# Patient Record
Sex: Female | Born: 1965 | Hispanic: Yes | Marital: Married | Smoking: Never smoker
Health system: Southern US, Community
[De-identification: ages and names within clinical notes are randomized; demographics above are authoritative.]

## PROBLEM LIST (undated history)

## (undated) DIAGNOSIS — R945 Abnormal results of liver function studies: Secondary | ICD-10-CM

## (undated) DIAGNOSIS — R7989 Other specified abnormal findings of blood chemistry: Secondary | ICD-10-CM

## (undated) HISTORY — DX: Other specified abnormal findings of blood chemistry: R79.89

## (undated) HISTORY — DX: Abnormal results of liver function studies: R94.5

---

## 2006-09-20 ENCOUNTER — Other Ambulatory Visit: Payer: Self-pay

## 2006-09-20 ENCOUNTER — Emergency Department: Payer: Self-pay | Admitting: Emergency Medicine

## 2006-12-14 ENCOUNTER — Ambulatory Visit: Payer: Self-pay | Admitting: Unknown Physician Specialty

## 2015-01-26 ENCOUNTER — Emergency Department: Payer: BLUE CROSS/BLUE SHIELD

## 2015-01-26 ENCOUNTER — Observation Stay
Admission: EM | Admit: 2015-01-26 | Discharge: 2015-01-29 | Disposition: A | Discharge status: Home / Self Care | Payer: BLUE CROSS/BLUE SHIELD | Attending: Surgery | Admitting: Surgery

## 2015-01-26 ENCOUNTER — Encounter: Payer: Self-pay | Admitting: Emergency Medicine

## 2015-01-26 DIAGNOSIS — K8070 Calculus of gallbladder and bile duct without cholecystitis without obstruction: Secondary | ICD-10-CM | POA: Diagnosis not present

## 2015-01-26 DIAGNOSIS — R1013 Epigastric pain: Secondary | ICD-10-CM | POA: Diagnosis present

## 2015-01-26 DIAGNOSIS — K8071 Calculus of gallbladder and bile duct without cholecystitis with obstruction: Secondary | ICD-10-CM | POA: Diagnosis not present

## 2015-01-26 DIAGNOSIS — K8020 Calculus of gallbladder without cholecystitis without obstruction: Secondary | ICD-10-CM | POA: Diagnosis present

## 2015-01-26 DIAGNOSIS — K8066 Calculus of gallbladder and bile duct with acute and chronic cholecystitis without obstruction: Principal | ICD-10-CM | POA: Insufficient documentation

## 2015-01-26 DIAGNOSIS — Z79899 Other long term (current) drug therapy: Secondary | ICD-10-CM | POA: Diagnosis not present

## 2015-01-26 DIAGNOSIS — R109 Unspecified abdominal pain: Secondary | ICD-10-CM | POA: Insufficient documentation

## 2015-01-26 LAB — COMPREHENSIVE METABOLIC PANEL
ALT: 604 U/L — ABNORMAL HIGH (ref 14–54)
AST: 851 U/L — AB (ref 15–41)
Albumin: 4.5 g/dL (ref 3.5–5.0)
Alkaline Phosphatase: 120 U/L (ref 38–126)
Anion gap: 10 (ref 5–15)
BUN: 13 mg/dL (ref 6–20)
CALCIUM: 9.6 mg/dL (ref 8.9–10.3)
CHLORIDE: 105 mmol/L (ref 101–111)
CO2: 25 mmol/L (ref 22–32)
CREATININE: 0.58 mg/dL (ref 0.44–1.00)
Glucose, Bld: 121 mg/dL — ABNORMAL HIGH (ref 65–99)
Potassium: 3.6 mmol/L (ref 3.5–5.1)
Sodium: 140 mmol/L (ref 135–145)
Total Bilirubin: 3.6 mg/dL — ABNORMAL HIGH (ref 0.3–1.2)
Total Protein: 7.7 g/dL (ref 6.5–8.1)

## 2015-01-26 LAB — CBC
HCT: 43.5 % (ref 35.0–47.0)
Hemoglobin: 14.4 g/dL (ref 12.0–16.0)
MCH: 29 pg (ref 26.0–34.0)
MCHC: 33 g/dL (ref 32.0–36.0)
MCV: 87.8 fL (ref 80.0–100.0)
Platelets: 273 10*3/uL (ref 150–440)
RBC: 4.95 MIL/uL (ref 3.80–5.20)
RDW: 12.9 % (ref 11.5–14.5)
WBC: 5.9 10*3/uL (ref 3.6–11.0)

## 2015-01-26 LAB — TROPONIN I

## 2015-01-26 LAB — LIPASE, BLOOD: Lipase: 57 U/L — ABNORMAL HIGH (ref 22–51)

## 2015-01-26 MED ORDER — ACETAMINOPHEN 650 MG RE SUPP: 650.0000 mg | Freq: Four times a day (QID) | RECTAL | Status: DC | PRN

## 2015-01-26 MED ORDER — HYDROMORPHONE HCL 1 MG/ML IJ SOLN: 1.0000 mg | INTRAMUSCULAR | Status: DC | PRN

## 2015-01-26 MED ORDER — KCL IN DEXTROSE-NACL 20-5-0.45 MEQ/L-%-% IV SOLN
INTRAVENOUS | Status: DC
  Administered 2015-01-26: 1000 mL via INTRAVENOUS
  Administered 2015-01-27 – 2015-01-28 (×4): via INTRAVENOUS
  Filled 2015-01-26 (×10): qty 1000

## 2015-01-26 MED ORDER — ACETAMINOPHEN 325 MG PO TABS: 650.0000 mg | ORAL_TABLET | Freq: Four times a day (QID) | ORAL | Status: DC | PRN

## 2015-01-26 MED ORDER — HYDROMORPHONE HCL 1 MG/ML IJ SOLN
INTRAMUSCULAR | Status: AC
  Administered 2015-01-26: 1 mg via INTRAVENOUS
  Filled 2015-01-26: qty 1

## 2015-01-26 MED ORDER — HYDROMORPHONE HCL 1 MG/ML IJ SOLN
1.0000 mg | Freq: Once | INTRAMUSCULAR | Status: AC
  Administered 2015-01-26: 1 mg via INTRAVENOUS

## 2015-01-26 MED ORDER — ONDANSETRON HCL 4 MG/2ML IJ SOLN
4.0000 mg | Freq: Once | INTRAMUSCULAR | Status: AC
  Administered 2015-01-26: 4 mg via INTRAVENOUS

## 2015-01-26 MED ORDER — HEPARIN SODIUM (PORCINE) 5000 UNIT/ML IJ SOLN
5000.0000 [IU] | Freq: Three times a day (TID) | INTRAMUSCULAR | Status: DC
  Administered 2015-01-26 – 2015-01-29 (×7): 5000 [IU] via SUBCUTANEOUS
  Filled 2015-01-26 (×8): qty 1

## 2015-01-26 MED ORDER — ONDANSETRON HCL 4 MG/2ML IJ SOLN
4.0000 mg | Freq: Four times a day (QID) | INTRAMUSCULAR | Status: DC | PRN
  Administered 2015-01-28: 4 mg via INTRAVENOUS

## 2015-01-26 MED ORDER — GADOBENATE DIMEGLUMINE 529 MG/ML IV SOLN
15.0000 mL | Freq: Once | INTRAVENOUS | Status: AC | PRN
  Administered 2015-01-26: 15 mL via INTRAVENOUS

## 2015-01-26 MED ORDER — ONDANSETRON HCL 4 MG PO TABS: 4.0000 mg | ORAL_TABLET | Freq: Four times a day (QID) | ORAL | Status: DC | PRN

## 2015-01-26 MED ORDER — ONDANSETRON HCL 4 MG/2ML IJ SOLN
INTRAMUSCULAR | Status: AC
  Administered 2015-01-26: 4 mg via INTRAVENOUS
  Filled 2015-01-26: qty 2

## 2015-01-26 MED ORDER — HYDROCODONE-ACETAMINOPHEN 5-325 MG PO TABS
1.0000 | ORAL_TABLET | ORAL | Status: DC | PRN
  Filled 2015-01-26: qty 1

## 2015-01-26 MED ORDER — SODIUM CHLORIDE 0.9 % IV SOLN
3.0000 g | Freq: Four times a day (QID) | INTRAVENOUS | Status: DC
  Administered 2015-01-26 – 2015-01-28 (×8): 3 g via INTRAVENOUS
  Filled 2015-01-26 (×12): qty 3

## 2015-01-26 NOTE — H&P (Signed)
Hailey Thomas is an 49 y.o. female.     Chief Complaint: Sudden onset of epigastric abdominal pain at 5:00 yesterday afternoon.  HPI:   This is a 49 year old Hispanic female without past medical or surgical history who presents to the emergency room this afternoon following the sudden onset of epigastric abdominal pain yesterday afternoon at around 5 pm. The patient had no nausea and no vomiting. She states that the pain was intense for an hour or two following initial onset. Following this the patient had some relief of her abdominal pain symptoms. The patient's had no jaundice, no fever and no prior episodes of such. There is no family history of cholelithiasis noted. She's had no history of diverticulitis or peptic ulcer disease. There is been no history of blood transfusion or intravenous drug use.   She worked her normal third shift then came home and had some coffee and then had worsening of the same type of pain.  She denies any nausea.  She came to the emergency room and was found to have gallstones on ultrasound as well as markedly elevated liver function tests. Surgical services were asked to evaluate.  History reviewed. No pertinent past medical history.  History reviewed. No pertinent past surgical history.  Family history is negative for cholelithiasis.  Social History:  reports that she has never smoked. She does not have any smokeless tobacco history on file. She reports that she does not drink alcohol. Her drug history is not on file.  Allergies: No Known Allergies  Medications Prior to Admission  Medication Sig Dispense Refill  . Cholecalciferol (VITAMIN D3) 5000 UNITS TABS Take 1 tablet by mouth daily.       Review of Systems:   Review of Systems  Constitutional: Negative for fever, chills, weight loss, malaise/fatigue and diaphoresis.  HENT: Negative.   Eyes: Negative.   Respiratory: Negative for cough, hemoptysis and shortness of breath.   Cardiovascular:  Negative for chest pain and palpitations.  Gastrointestinal: Positive for abdominal pain. Negative for heartburn, nausea, vomiting, diarrhea, constipation, blood in stool and melena.       No jaundice.  Genitourinary: Negative.   Musculoskeletal: Negative.   Skin: Negative.   Neurological: Negative.  Negative for weakness.  Psychiatric/Behavioral: Negative for depression and suicidal ideas.  All other systems reviewed and are negative.   Physical Exam:  Physical Exam  Constitutional: She is oriented to person, place, and time and well-developed, well-nourished, and in no distress. No distress.  HENT:  Head: Normocephalic and atraumatic.  Eyes: Pupils are equal, round, and reactive to light. No scleral icterus.  Neck: Normal range of motion. Neck supple. No thyromegaly present.  Cardiovascular: Normal rate and regular rhythm.   No murmur heard. Pulmonary/Chest: Effort normal and breath sounds normal.  Abdominal: Soft. She exhibits no distension, no ascites and no mass. There is no hepatosplenomegaly. There is tenderness in the right upper quadrant and epigastric area. There is positive Murphy's sign. There is no rigidity, no rebound, no guarding and no tenderness at McBurney's point. No hernia.    Musculoskeletal: Normal range of motion. She exhibits no edema.  Neurological: She is alert and oriented to person, place, and time.  Skin: Skin is warm and dry. She is not diaphoretic.  Psychiatric: Memory, affect and judgment normal.    Blood pressure 130/80, pulse 58, temperature 98.2 F (36.8 C), temperature source Oral, resp. rate 18, height $RemoveBe'4\' 7"'lxalBIWws$  (1.397 m), weight 70.58 kg (155 lb 9.6 oz), last menstrual period  01/02/2015, SpO2 92 %.    Results for orders placed or performed during the hospital encounter of 01/26/15 (from the past 48 hour(s))  CBC     Status: None   Collection Time: 01/26/15  9:32 AM  Result Value Ref Range   WBC 5.9 3.6 - 11.0 K/uL   RBC 4.95 3.80 - 5.20  MIL/uL   Hemoglobin 14.4 12.0 - 16.0 g/dL   HCT 43.5 35.0 - 47.0 %   MCV 87.8 80.0 - 100.0 fL   MCH 29.0 26.0 - 34.0 pg   MCHC 33.0 32.0 - 36.0 g/dL   RDW 12.9 11.5 - 14.5 %   Platelets 273 150 - 440 K/uL  Troponin I     Status: None   Collection Time: 01/26/15  9:32 AM  Result Value Ref Range   Troponin I <0.03 <0.031 ng/mL    Comment:        NO INDICATION OF MYOCARDIAL INJURY.   Comprehensive metabolic panel     Status: Abnormal   Collection Time: 01/26/15  9:32 AM  Result Value Ref Range   Sodium 140 135 - 145 mmol/L   Potassium 3.6 3.5 - 5.1 mmol/L   Chloride 105 101 - 111 mmol/L   CO2 25 22 - 32 mmol/L   Glucose, Bld 121 (H) 65 - 99 mg/dL   BUN 13 6 - 20 mg/dL   Creatinine, Ser 0.58 0.44 - 1.00 mg/dL   Calcium 9.6 8.9 - 10.3 mg/dL   Total Protein 7.7 6.5 - 8.1 g/dL   Albumin 4.5 3.5 - 5.0 g/dL   AST 851 (H) 15 - 41 U/L   ALT 604 (H) 14 - 54 U/L   Alkaline Phosphatase 120 38 - 126 U/L   Total Bilirubin 3.6 (H) 0.3 - 1.2 mg/dL   GFR calc non Af Amer >60 >60 mL/min   GFR calc Af Amer >60 >60 mL/min    Comment: (NOTE) The eGFR has been calculated using the CKD EPI equation. This calculation has not been validated in all clinical situations. eGFR's persistently <60 mL/min signify possible Chronic Kidney Disease.    Anion gap 10 5 - 15  Lipase, blood     Status: Abnormal   Collection Time: 01/26/15  9:32 AM  Result Value Ref Range   Lipase 57 (H) 22 - 51 U/L   Mr Lambert Mody Cm/mrcp  01/26/2015   CLINICAL DATA:  Subsequent encounter for severe upper epigastric pain since yesterday nausea and vomiting with multiple gallstones seen on recent ultrasound exam.  EXAM: MRI ABDOMEN WITHOUT CONTRAST  (INCLUDING MRCP)  TECHNIQUE: Multiplanar multisequence MR imaging of the abdomen was performed. Heavily T2-weighted images of the biliary and pancreatic ducts were obtained, and three-dimensional MRCP images were rendered by post processing.  COMPARISON:  Ultrasound exam from earlier  today.  FINDINGS: Lower chest:  Unremarkable.  Hepatobiliary: No focal abnormality within the liver parenchyma. All multiple gallstones are evident measuring up to 10 mm in diameter. No evidence for gallbladder wall thickening or pericholecystic fluid. There is no intra or extrahepatic biliary duct dilatation. No evidence for choledocholithiasis.  Pancreas: No focal mass lesion. No dilatation of the main duct. No intraparenchymal cyst. No peripancreatic edema.  Spleen: No splenomegaly. No focal mass lesion.  Adrenals/Urinary Tract: No adrenal nodule or mass. No focal abnormality or hydronephrosis is seen in either kidney.  Stomach/Bowel: Stomach is distended with food and fluid. Duodenum is normally positioned as is the ligament of Treitz.  Vascular/Lymphatic: No abdominal aortic  aneurysm. No evidence for lymphadenopathy in the abdomen  Other: No intraperitoneal free fluid.  Musculoskeletal: No abnormal marrow signal within the visualized bony anatomy.  IMPRESSION: Cholelithiasis without gallbladder wall thickening or pericholecystic fluid.  No evidence for biliary duct dilatation.  No choledocholithiasis.   Electronically Signed   By: Misty Stanley M.D.   On: 01/26/2015 12:46   US Abdomen Limited Ruq  01/26/2015   CLINICAL DATA:  Epigastric pain since early this morning.  EXAM: US ABDOMEN LIMITED - RIGHT UPPER QUADRANT  COMPARISON:  None.  FINDINGS: Gallbladder:  Gallbladder is moderately distended containing numerous shadowing stones and sludge. No wall thickening. No pericholecystic fluid.  Common bile duct:  Diameter: 2.7 mm.  No sonographic evidence of a duct stone.  Liver:  No focal lesion identified. Within normal limits in parenchymal echogenicity.  IMPRESSION: 1. Cholelithiasis.  No sonographic evidence of acute cholecystitis. 2. No bile duct dilation.  Normal liver.   Electronically Signed   By: Lajean Manes M.D.   On: 01/26/2015 10:37   Images were personally reviewed reviewed on the PACS  monitor.  Assessment/Plan  This is an otherwise healthy 49 year old Hispanic female with cholelithiasis and abdominal pain along with hyperbilirubinemia and transaminitis with what looks like passed choledocholithiasis. I do not see any obvious findings of choledocholithiasis on her MRI/MRCP scan. Her history and laboratory biochemical data along with her exam are suggestive of a passed stone in the bile duct.    I discussed with her and her husband at length that she would likely have symptomatic improvement by tomorrow with hydration and IV antibiotics.  I will repeat her liver function tests in the morning and suspect that they will improve. Following this the patient would and if it from elective cholecystectomy with intraoperative cholangiography. I will reassess her in the morning. All of her questions were answered.  Plan and diagnosis discussed also with her RN.  Hortencia Conradi, MD, FACS

## 2015-01-26 NOTE — ED Notes (Signed)
Epigastric pain since last pm.

## 2015-01-26 NOTE — ED Notes (Signed)
Patient transported to MRI at this time. 

## 2015-01-26 NOTE — ED Provider Notes (Signed)
[facility] Emergency Department Provider Note  ____________________________________________  Time seen:  9:30 AM  I have reviewed the triage vital signs and the nursing notes.   HISTORY  Chief Complaint Abdominal Pain     HPI Hailey Thomas is a 49 y.o. female who reports she began having epigastric pain and pressure last night. This continues to be uncomfortable for her. It is moderate. She feels like she needs to burp. She points to a rather focal area at the bottom of her sternum and epigastric area. She denies anynausea or shortness of breath. She reports she had similar symptoms 6-7 years ago and was diagnosed with reflux then. She does not have any history of hypertension, diabetes, or heart disease. She has no past surgical history.    History reviewed. No pertinent past medical history.  There are no active problems to display for this patient.   History reviewed. No pertinent past surgical history.  Current Outpatient Rx  Name  Route  Sig  Dispense  Refill  . Cholecalciferol (VITAMIN D3) 5000 UNITS TABS   Oral   Take 1 tablet by mouth daily.           Allergies Review of patient's allergies indicates no known allergies.  No family history on file.  Social History History  Substance Use Topics  . Smoking status: Never Smoker   . Smokeless tobacco: Not on file  . Alcohol Use: No    Review of Systems  Constitutional: Negative for fever. ENT: Negative for sore throat. Cardiovascular: Epigastric discomfort. See history of present illness Respiratory: Negative for shortness of breath. Gastrointestinal: Epigastric discomfort. See history of present illness Genitourinary: Negative for dysuria. Musculoskeletal: No myalgias or injuries. Skin: Negative for rash. Neurological: Negative for headaches   10-point ROS otherwise negative.  ____________________________________________   PHYSICAL EXAM:  VITAL SIGNS: ED Triage  Vitals  Enc Vitals Group     BP 01/26/15 0910 148/92 mmHg     Pulse Rate 01/26/15 0910 60     Resp 01/26/15 0910 18     Temp 01/26/15 0910 98.3 F (36.8 C)     Temp Source 01/26/15 0910 Oral     SpO2 01/26/15 0910 100 %     Weight 01/26/15 0910 162 lb (73.483 kg)     Height 01/26/15 0910 4\' 7"  (1.397 m)     Head Cir --      Peak Flow --      Pain Score 01/26/15 0911 9     Pain Loc --      Pain Edu? --      Excl. in GC? --     Constitutional:  Alert and oriented. When initially seen, the patient is supine on the bed and appears uncomfortable.  ENT   Head: Normocephalic and atraumatic.   Nose: No congestion/rhinnorhea. Cardiovascular: Normal rate, regular rhythm, no murmur noted.  No tenderness to the chest/sternum. Respiratory:  Normal respiratory effort, no tachypnea.    Breath sounds are clear and equal bilaterally.  Gastrointestinal: Soft with mild tenderness to the epigastric area. No tenderness in the right upper quadrant or elsewhere.. No distention.  Back: No muscle spasm, no tenderness, no CVA tenderness. Musculoskeletal: No deformity noted. Nontender with normal range of motion in all extremities.  No noted edema. Neurologic:  Normal speech and language. No gross focal neurologic deficits are appreciated.  Skin:  Skin is warm, dry. No rash noted. Psychiatric: Patient is alert and communicative with an intact thought process.  She overall appears appropriate. She does appear uncomfortable and has a slightly odd affect.  ____________________________________________    LABS (pertinent positives/negatives)  CBC: Within normal limits Troponin: Negative Metabolic panel: Normal electrolytes and renal function. Glucose of 121. LFTs are elevated with AST of 851 and ALT 604, bilirubin 3.6. Lipase 57  ____________________________________________   EKG  ED ECG REPORT I, Klare Criss W, the attending physician, personally viewed and interpreted this ECG.   Date:  01/26/2015  EKG Time: 9:13 AM  Rate: 56  Rhythm: Sinus bradycardia  Axis: Normal  Intervals: Normal  ST&T Change: None noted   ____________________________________________    RADIOLOGY  Ultrasound of the right of a quadrant shows a gallbladder that is somewhat distended with stones and sludge. No wall thickening or color 6 fluid. The common bile duct appears normal.  ____________________________________________  ____________________________________________   INITIAL IMPRESSION / ASSESSMENT AND PLAN / ED COURSE  Pertinent labs & imaging results that were available during my care of the patient were reviewed by me and considered in my medical decision making (see chart for details).  Patient with upper abdominal pain worrisome for biliary colic area and we'll obtain an ultrasound.  ----------------------------------------- 11:00 AM on 01/26/2015 -----------------------------------------  Patient's LFTs are elevated along with bilirubin. Her ultrasound shows gallstones but no biliary duct dilatation. We have called and spoken with Dr. Natale Lay, general surgery. He is requesting an MRCP and will see the patient in the emergency department.  We will treat the patient's pain with IV Dilaudid at this time.  ____________________________________________   FINAL CLINICAL IMPRESSION(S) / ED DIAGNOSES  Final diagnoses:  Abdominal pain  Gallstones  Hyperbilirubinemia      Darien Ramus, MD 01/26/15 [phone]

## 2015-01-27 DIAGNOSIS — R109 Unspecified abdominal pain: Secondary | ICD-10-CM | POA: Insufficient documentation

## 2015-01-27 LAB — COMPREHENSIVE METABOLIC PANEL
ALT: 635 U/L — ABNORMAL HIGH (ref 14–54)
ANION GAP: 5 (ref 5–15)
AST: 530 U/L — ABNORMAL HIGH (ref 15–41)
Albumin: 3.6 g/dL (ref 3.5–5.0)
Alkaline Phosphatase: 118 U/L (ref 38–126)
BUN: 7 mg/dL (ref 6–20)
CO2: 28 mmol/L (ref 22–32)
CREATININE: 0.52 mg/dL (ref 0.44–1.00)
Calcium: 8.6 mg/dL — ABNORMAL LOW (ref 8.9–10.3)
Chloride: 107 mmol/L (ref 101–111)
GFR calc Af Amer: >60 mL/min (ref >60)
GFR calc non Af Amer: >60 mL/min (ref >60)
Glucose, Bld: 110 mg/dL — ABNORMAL HIGH (ref 65–99)
Potassium: 3.6 mmol/L (ref 3.5–5.1)
Sodium: 140 mmol/L (ref 135–145)
TOTAL PROTEIN: 6.3 g/dL — AB (ref 6.5–8.1)
Total Bilirubin: 4.8 mg/dL — ABNORMAL HIGH (ref 0.3–1.2)

## 2015-01-27 LAB — SURGICAL PCR SCREEN
MRSA, PCR: NEGATIVE
Staphylococcus aureus: NEGATIVE

## 2015-01-27 NOTE — Progress Notes (Signed)
[facility]   PATIENT NAME: Hailey Thomas    MR#:  [id number]  DATE OF BIRTH:  02/27/1966  SUBJECTIVE:   Feels somewhat better but continued epigastric pain. No nausea no vomiting.    Interested in timing of surgery planned.  REVIEW OF SYSTEMS:   Review of Systems  Constitutional: Negative for fever and chills.  Gastrointestinal: Positive for abdominal pain. Negative for nausea and vomiting.  Neurological: Negative for headaches.  Psychiatric/Behavioral: Negative.   All other systems reviewed and are negative.   DRUG ALLERGIES:  No Known Allergies  VITALS:  Blood pressure 113/72, pulse 48, temperature 98.4 F (36.9 C), temperature source Oral, resp. rate 16, height 4\' 7"  (1.397 m), weight 68.72 kg (151 lb 8 oz), last menstrual period 01/02/2015, SpO2 98 %.  Filed Vitals:   01/26/15 1350 01/26/15 2316 01/27/15 0512 01/27/15 0805  BP:  130/72  113/72  Pulse:  51  48  Temp:  98.2 F (36.8 C)  98.4 F (36.9 C)  TempSrc:  Oral  Oral  Resp:  18  16  Height:      Weight: 70.58 kg (155 lb 9.6 oz)  68.72 kg (151 lb 8 oz)   SpO2:  97%  98%   PHYSICAL EXAMINATION:  GENERAL:  [age] y.o.-year-old patient lying in the bed with no acute distress.  EYES: Pupils equal, round, reactive to light and accommodation. Mild scleral icterus. Extraocular muscles intact.  HEENT: Head atraumatic, normocephalic. Oropharynx and nasopharynx clear.  NECK:  Supple, no jugular venous distention. No thyroid enlargement, no tenderness.  LUNGS: Normal breath sounds bilaterally, no wheezing, rales,rhonchi or crepitation. No use of accessory muscles of respiration.  CARDIOVASCULAR: S1, S2 normal. No murmurs, rubs, or gallops.  ABDOMEN: Soft, tender in epigastric area, nondistended. Bowel sounds present. No organomegaly or mass.  EXTREMITIES: No pedal edema, cyanosis, or clubbing.  NEUROLOGIC: Cranial nerves II through XII are intact. Muscle strength 5/5 in all extremities. Sensation intact.  Gait not checked.  PSYCHIATRIC: The patient is alert and oriented x 3.  SKIN: No obvious rash, lesion, or ulcer.    CMP Latest Ref Rng 01/27/2015 01/26/2015  Glucose 65 - 99 mg/dL 914(N110(H) 829(F121(H)  BUN 6 - 20 mg/dL 7 13  Creatinine 6.210.44 - 1.00 mg/dL 3.080.52 6.570.58  Sodium 846135 - 145 mmol/L 140 140  Potassium 3.5 - 5.1 mmol/L 3.6 3.6  Chloride 101 - 111 mmol/L 107 105  CO2 22 - 32 mmol/L 28 25  Calcium 8.9 - 10.3 mg/dL 9.6(E8.6(L) 9.6  Total Protein 6.5 - 8.1 g/dL 6.3(L) 7.7  Total Bilirubin 0.3 - 1.2 mg/dL 4.8(H) 3.6(H)  Alkaline Phos 38 - 126 U/L 118 120  AST 15 - 41 U/L 530(H) 851(H)  ALT 14 - 54 U/L 635(H) 604(H)    ASSESSMENT AND PLAN:   Rising bilirubin but decreasing transaminases. The patient either has a retained common bile duct stone which is not visible an MRCP or she has periampullary edema secondary to a passed stone. I will get gastroenterology to see her in anticipation of a possible ERCP based on liver function testing tomorrow morning. At present time until the duct is cleared I am not planning cholecystectomy. I discussed this with her and she is in agreement.  MRCP scan was personally reviewed yesterday.

## 2015-01-27 NOTE — Consult Note (Signed)
GI Inpatient Consult Note Christena Deem MD  Reason for Consult: Abnormal liver associated enzymes possible need for ERCP.   Attending Requesting Consult: Dr. Loura Back  Outpatient Primary Physician: Unknown  History of Present Illness: Hailey Thomas is a 49 y.o. female who presented to the emergency room at [facility] last night with a complaint of right upper quadrant pain. He states that this began yesterday early in the morning ever over the course of the day became worse. She did take some Alka-Seltzer which seem to be of no benefit. Came to the emergency room early this morning. She has had nausea but no vomiting. Had a similar episode several years ago.  She is currently feeling much better. Prior to this episode beginning she has had no problems with nausea vomiting or abdominal pain. There has been no heartburn or dysphagia. He usually has a bowel movement daily. She has seen no black stools. She states she saw some blood with her stool about a year ago but that has not repeated. She has never had a colonoscopy. There is no history of peptic ulcer disease.  GI family history-negative for gallbladder problems. Her father had peptic ulcer disease. There is no family history of colon cancer or cirrhosis.  Past Medical History:  History reviewed. No pertinent past medical history.  Problem List: Patient Active Problem List   Diagnosis Date Noted  . Cholelithiasis 01/26/2015    Past Surgical History: History reviewed. No pertinent past surgical history.  Allergies: No Known Allergies  Home Medications: Prescriptions prior to admission  Medication Sig Dispense Refill Last Dose  . Cholecalciferol (VITAMIN D3) 5000 UNITS TABS Take 1 tablet by mouth daily.   01/25/2015 at am   Home medication reconciliation was completed with the patient.   Scheduled Inpatient Medications:   . ampicillin-sulbactam (UNASYN) IV  3 g Intravenous Q6H  . heparin  5,000  Units Subcutaneous 3 times per day    Continuous Inpatient Infusions:   . dextrose 5 % and 0.45 % NaCl with KCl 20 mEq/L 125 mL/hr at 01/27/15 1330    PRN Inpatient Medications:  acetaminophen **OR** acetaminophen, HYDROcodone-acetaminophen, HYDROmorphone (DILAUDID) injection, ondansetron **OR** ondansetron (ZOFRAN) IV  Family History: family history is not on file.   GI Family History: Noted above  Social History:   reports that she has never smoked. She does not have any smokeless tobacco history on file. She reports that she does not drink alcohol. The patient denies ETOH, tobacco, or drug use.   ROS  Review of Systems: 10 systems reviewed per admission history and physical, agree with same.  Physical Examination: BP 139/89 mmHg  Pulse 55  Temp(Src) 97.5 F (36.4 C) (Oral)  Resp 16  Ht  (1.397 m)  Wt 68.72 kg (151 lb 8 oz)  BMI 35.21 kg/m2  SpO2 100%  LMP 01/02/2015 Gen: Well-appearing 49 year old female no acute distress HEENT: Normocephalic atraumatic eyes are anicteric Neck: Supple no JVD no lymphadenopathy no thyromegaly Chest: Bilaterally clear to auscultation CV: Regular rate and rhythm without rub murmur or gallop Abd: Nontender nondistended bowel sounds positive and normoactive Ext: No clubbing cyanosis or edema Skin: Warm, dry Other:  Data: Lab Results  Component Value Date   WBC 5.9 01/26/2015   HGB 14.4 01/26/2015   HCT 43.5 01/26/2015   MCV 87.8 01/26/2015   PLT 273 01/26/2015    Recent Labs Lab 01/26/15 0932  HGB 14.4   Lab Results  Component Value Date  NA 140 01/27/2015   K 3.6 01/27/2015   CL 107 01/27/2015   CO2 28 01/27/2015   BUN 7 01/27/2015   CREATININE 0.52 01/27/2015   Lab Results  Component Value Date   ALT 635* 01/27/2015   AST 530* 01/27/2015   ALKPHOS 118 01/27/2015   BILITOT 4.8* 01/27/2015   No results for input(s): APTT, INR, PTT in the last 168 hours. CBC Latest Ref Rng 01/26/2015  WBC 3.6 - 11.0 K/uL  5.9  Hemoglobin 12.0 - 16.0 g/dL 40.914.4  Hematocrit 81.135.0 - 47.0 % 43.5  Platelets 150 - 440 K/uL 273    STUDIES: Mr Jorja Loabd W Cm/mrcp  01/26/2015   CLINICAL DATA:  Subsequent encounter for severe upper epigastric pain since yesterday nausea and vomiting with multiple gallstones seen on recent ultrasound exam.  EXAM: MRI ABDOMEN WITHOUT CONTRAST  (INCLUDING MRCP)  TECHNIQUE: Multiplanar multisequence MR imaging of the abdomen was performed. Heavily T2-weighted images of the biliary and pancreatic ducts were obtained, and three-dimensional MRCP images were rendered by post processing.  COMPARISON:  Ultrasound exam from earlier today.  FINDINGS: Lower chest:  Unremarkable.  Hepatobiliary: No focal abnormality within the liver parenchyma. All multiple gallstones are evident measuring up to 10 mm in diameter. No evidence for gallbladder wall thickening or pericholecystic fluid. There is no intra or extrahepatic biliary duct dilatation. No evidence for choledocholithiasis.  Pancreas: No focal mass lesion. No dilatation of the main duct. No intraparenchymal cyst. No peripancreatic edema.  Spleen: No splenomegaly. No focal mass lesion.  Adrenals/Urinary Tract: No adrenal nodule or mass. No focal abnormality or hydronephrosis is seen in either kidney.  Stomach/Bowel: Stomach is distended with food and fluid. Duodenum is normally positioned as is the ligament of Treitz.  Vascular/Lymphatic: No abdominal aortic aneurysm. No evidence for lymphadenopathy in the abdomen  Other: No intraperitoneal free fluid.  Musculoskeletal: No abnormal marrow signal within the visualized bony anatomy.  IMPRESSION: Cholelithiasis without gallbladder wall thickening or pericholecystic fluid.  No evidence for biliary duct dilatation.  No choledocholithiasis.   Electronically Signed   By: Kennith CenterEric  Mansell M.D.   On: 01/26/2015 12:46   [place] Abdomen Limited Ruq  01/26/2015   CLINICAL DATA:  Epigastric pain since early this morning.  EXAM: US ABDOMEN  LIMITED - RIGHT UPPER QUADRANT  COMPARISON:  None.  FINDINGS: Gallbladder:  Gallbladder is moderately distended containing numerous shadowing stones and sludge. No wall thickening. No pericholecystic fluid.  Common bile duct:  Diameter: 2.7 mm.  No sonographic evidence of a duct stone.  Liver:  No focal lesion identified. Within normal limits in parenchymal echogenicity.  IMPRESSION: 1. Cholelithiasis.  No sonographic evidence of acute cholecystitis. 2. No bile duct dilation.  Normal liver.   Electronically Signed   By: Amie Portlandavid  Ormond M.D.   On: 01/26/2015 10:37   @IMAGES @  Assessment: 1. Cholelithiasis noted on her ultrasound and MRCP. There was no evidence of choledocholithiasis on either test. There is no evidence of intra-or extrahepatic biliary dilatation. It is of note that her liver associated enzymes have improved with the exception of the bilirubin which was a little higher today than yesterday. This May BE late in turning to normal and can typically seen so.  Recommendations: Repeat LFTs tomorrow. If the bilirubin has improved I believe that cholecystectomy and intraoperative cholangiogram may be approached without previous ERCP. Will follow with you.  Thank you for the consult. Please call with questions or concerns.  Christena DeemSKULSKIE, Chaunda Vandergriff U, MD  01/27/2015 6:16 PM

## 2015-01-28 ENCOUNTER — Observation Stay: Payer: BLUE CROSS/BLUE SHIELD | Admitting: Anesthesiology

## 2015-01-28 ENCOUNTER — Observation Stay: Payer: BLUE CROSS/BLUE SHIELD

## 2015-01-28 ENCOUNTER — Encounter: Payer: Self-pay | Admitting: Anesthesiology

## 2015-01-28 ENCOUNTER — Encounter
Admission: EM | Disposition: A | Discharge status: Home / Self Care | Payer: Self-pay | Source: Home / Self Care | Attending: Emergency Medicine

## 2015-01-28 DIAGNOSIS — K8071 Calculus of gallbladder and bile duct without cholecystitis with obstruction: Secondary | ICD-10-CM

## 2015-01-28 HISTORY — PX: CHOLECYSTECTOMY: SHX55

## 2015-01-28 LAB — COMPREHENSIVE METABOLIC PANEL
ALK PHOS: 120 U/L (ref 38–126)
ALT: 434 U/L — AB (ref 14–54)
ANION GAP: 7 (ref 5–15)
AST: 189 U/L — AB (ref 15–41)
Albumin: 3.7 g/dL (ref 3.5–5.0)
BUN: 10 mg/dL (ref 6–20)
CHLORIDE: 106 mmol/L (ref 101–111)
CO2: 26 mmol/L (ref 22–32)
Calcium: 9 mg/dL (ref 8.9–10.3)
Creatinine, Ser: 0.59 mg/dL (ref 0.44–1.00)
GFR calc Af Amer: >60 mL/min (ref >60)
GFR calc non Af Amer: >60 mL/min (ref >60)
Glucose, Bld: 126 mg/dL — ABNORMAL HIGH (ref 65–99)
Potassium: 3.9 mmol/L (ref 3.5–5.1)
Sodium: 139 mmol/L (ref 135–145)
Total Bilirubin: 1.6 mg/dL — ABNORMAL HIGH (ref 0.3–1.2)
Total Protein: 6.7 g/dL (ref 6.5–8.1)

## 2015-01-28 SURGERY — LAPAROSCOPIC CHOLECYSTECTOMY WITH INTRAOPERATIVE CHOLANGIOGRAM
Anesthesia: General

## 2015-01-28 MED ORDER — MIDAZOLAM HCL 2 MG/2ML IJ SOLN
INTRAMUSCULAR | Status: DC | PRN
  Administered 2015-01-28: 2 mg via INTRAVENOUS

## 2015-01-28 MED ORDER — DEXAMETHASONE SODIUM PHOSPHATE 4 MG/ML IJ SOLN
INTRAMUSCULAR | Status: DC | PRN
  Administered 2015-01-28: 10 mg via INTRAVENOUS

## 2015-01-28 MED ORDER — GLYCOPYRROLATE 0.2 MG/ML IJ SOLN
INTRAMUSCULAR | Status: DC | PRN
Start: 2015-01-28 — End: 2015-01-28
  Administered 2015-01-28: 0.2 mg via INTRAVENOUS
  Administered 2015-01-28: 0.4 mg via INTRAVENOUS

## 2015-01-28 MED ORDER — BUPIVACAINE HCL 0.25 % IJ SOLN
INTRAMUSCULAR | Status: DC | PRN
  Administered 2015-01-28: 20 mL

## 2015-01-28 MED ORDER — BUPIVACAINE HCL (PF) 0.25 % IJ SOLN
INTRAMUSCULAR | Status: AC
  Filled 2015-01-28: qty 30

## 2015-01-28 MED ORDER — FENTANYL CITRATE (PF) 100 MCG/2ML IJ SOLN: 25.0000 ug | INTRAMUSCULAR | Status: DC | PRN

## 2015-01-28 MED ORDER — HYDROCODONE-ACETAMINOPHEN 5-325 MG PO TABS: 1.0000 | ORAL_TABLET | ORAL | Status: DC | PRN

## 2015-01-28 MED ORDER — BUPIVACAINE HCL 0.25 % IJ SOLN: INTRAMUSCULAR | Status: DC | PRN

## 2015-01-28 MED ORDER — KETOROLAC TROMETHAMINE 30 MG/ML IJ SOLN
INTRAMUSCULAR | Status: DC | PRN
  Administered 2015-01-28: 30 mg via INTRAVENOUS

## 2015-01-28 MED ORDER — PROPOFOL 10 MG/ML IV BOLUS
INTRAVENOUS | Status: DC | PRN
Start: 2015-01-28 — End: 2015-01-28
  Administered 2015-01-28: 120 mg via INTRAVENOUS

## 2015-01-28 MED ORDER — ROCURONIUM BROMIDE 100 MG/10ML IV SOLN
INTRAVENOUS | Status: DC | PRN
Start: 2015-01-28 — End: 2015-01-28
  Administered 2015-01-28: 10 mg via INTRAVENOUS
  Administered 2015-01-28: 30 mg via INTRAVENOUS

## 2015-01-28 MED ORDER — LIDOCAINE HCL (CARDIAC) 20 MG/ML IV SOLN
INTRAVENOUS | Status: DC | PRN
  Administered 2015-01-28: 100 mg via INTRAVENOUS

## 2015-01-28 MED ORDER — OXYCODONE HCL 5 MG PO TABS: 5.0000 mg | ORAL_TABLET | Freq: Once | ORAL | Status: DC | PRN

## 2015-01-28 MED ORDER — FENTANYL CITRATE (PF) 100 MCG/2ML IJ SOLN
INTRAMUSCULAR | Status: DC | PRN
Start: 2015-01-28 — End: 2015-01-28
  Administered 2015-01-28: 100 ug via INTRAVENOUS

## 2015-01-28 MED ORDER — IOTHALAMATE MEGLUMINE 60 % INJ SOLN
INTRAMUSCULAR | Status: DC | PRN
  Administered 2015-01-28: 10 mL via INTRAMUSCULAR

## 2015-01-28 MED ORDER — NEOSTIGMINE METHYLSULFATE 10 MG/10ML IV SOLN
INTRAVENOUS | Status: DC | PRN
  Administered 2015-01-28: 3 mg via INTRAVENOUS

## 2015-01-28 MED ORDER — LACTATED RINGERS IV SOLN
INTRAVENOUS | Status: DC | PRN
  Administered 2015-01-28: 11:00:00 via INTRAVENOUS

## 2015-01-28 MED ORDER — OXYCODONE HCL 5 MG/5ML PO SOLN: 5.0000 mg | Freq: Once | ORAL | Status: DC | PRN

## 2015-01-28 MED ORDER — SUCCINYLCHOLINE CHLORIDE 20 MG/ML IJ SOLN
INTRAMUSCULAR | Status: DC | PRN
  Administered 2015-01-28: 100 mg via INTRAVENOUS

## 2015-01-28 SURGICAL SUPPLY — 49 items
APPLIER CLIP 5 13 M/L LIGAMAX5 (MISCELLANEOUS) ×3
ARROW PERCUTANEOUS CHOLANGIOGRAM ×3 IMPLANT
BAG COUNTER SPONGE EZ (MISCELLANEOUS) IMPLANT
BENZOIN TINCTURE PRP APPL 2/3 (GAUZE/BANDAGES/DRESSINGS) ×3 IMPLANT
BULB RESERV EVAC DRAIN JP 100C (MISCELLANEOUS) IMPLANT
CANISTER SUCT 1200ML W/VALVE (MISCELLANEOUS) ×3 IMPLANT
CATH CHOLANG 76X19 KUMAR (CATHETERS) ×3 IMPLANT
CHLORAPREP W/TINT 26ML (MISCELLANEOUS) ×3 IMPLANT
CLIP APPLIE 5 13 M/L LIGAMAX5 (MISCELLANEOUS) ×1 IMPLANT
CLOSURE WOUND 1/2 X4 (GAUZE/BANDAGES/DRESSINGS) ×1
COUNTER SPONGE BAG EZ (MISCELLANEOUS)
DEFOGGER SCOPE WARMER CLEARIFY (MISCELLANEOUS) ×3 IMPLANT
DISSECTOR KITTNER STICK (MISCELLANEOUS) ×1 IMPLANT
DISSECTORS/KITTNER STICK (MISCELLANEOUS) ×3
DRAIN CHANNEL JP 19F (MISCELLANEOUS) IMPLANT
DRAPE SHEET LG 3/4 BI-LAMINATE (DRAPES) ×3 IMPLANT
DRAPE UTILITY 15X26 TOWEL STRL (DRAPES) ×6 IMPLANT
DRESSING TELFA 4X3 1S ST N-ADH (GAUZE/BANDAGES/DRESSINGS) ×3 IMPLANT
DRSG TEGADERM 2-3/8X2-3/4 SM (GAUZE/BANDAGES/DRESSINGS) ×12 IMPLANT
DRSG TEGADERM 2X2.25 PEDS (GAUZE/BANDAGES/DRESSINGS) ×3 IMPLANT
DRSG TELFA 3X8 NADH (GAUZE/BANDAGES/DRESSINGS) ×3 IMPLANT
ENDOPOUCH RETRIEVER 10 (MISCELLANEOUS) ×3 IMPLANT
GLOVE BIO SURGEON STRL SZ7.5 (GLOVE) ×9 IMPLANT
GOWN STRL REUS W/ TWL LRG LVL3 (GOWN DISPOSABLE) ×1 IMPLANT
GOWN STRL REUS W/ TWL XL LVL3 (GOWN DISPOSABLE) ×1 IMPLANT
GOWN STRL REUS W/TWL LRG LVL3 (GOWN DISPOSABLE) ×2
GOWN STRL REUS W/TWL XL LVL3 (GOWN DISPOSABLE) ×2
IRRIGATION STRYKERFLOW (MISCELLANEOUS) ×1 IMPLANT
IRRIGATOR STRYKERFLOW (MISCELLANEOUS) ×3
LABEL OR SOLS (LABEL) ×3 IMPLANT
NEEDLE 18GX1X1/2 (RX/OR ONLY) (NEEDLE) IMPLANT
NEEDLE HYPO 25X1 1.5 SAFETY (NEEDLE) ×3 IMPLANT
NS IRRIG 500ML POUR BTL (IV SOLUTION) ×3 IMPLANT
PACK LAP CHOLECYSTECTOMY (MISCELLANEOUS) ×3 IMPLANT
PAD GROUND ADULT SPLIT (MISCELLANEOUS) ×3 IMPLANT
SCISSORS METZENBAUM CVD 33 (INSTRUMENTS) ×3 IMPLANT
SLEEVE ADV FIXATION 5X100MM (TROCAR) ×6 IMPLANT
STRAP SAFETY BODY (MISCELLANEOUS) ×3 IMPLANT
STRIP CLOSURE SKIN 1/2X4 (GAUZE/BANDAGES/DRESSINGS) ×2 IMPLANT
SUT ETHILON 3-0 FS-10 30 BLK (SUTURE) ×3
SUT VIC AB 0 UR5 27 (SUTURE) ×9 IMPLANT
SUT VIC AB 4-0 FS2 27 (SUTURE) ×3 IMPLANT
SUTURE EHLN 3-0 FS-10 30 BLK (SUTURE) ×1 IMPLANT
SWABSTK COMLB BENZOIN TINCTURE (MISCELLANEOUS) ×3 IMPLANT
SYR 3ML LL SCALE MARK (SYRINGE) ×3 IMPLANT
TROCAR XCEL BLUNT TIP 100MML (ENDOMECHANICALS) ×3 IMPLANT
TROCAR Z-THREAD FIOS 11X100 BL (TROCAR) ×3 IMPLANT
TROCAR Z-THREAD SLEEVE 11X100 (TROCAR) ×3 IMPLANT
TUBING INSUFFLATOR HI FLOW (MISCELLANEOUS) ×3 IMPLANT

## 2015-01-28 NOTE — Anesthesia Procedure Notes (Signed)
Procedure Name: Intubation Date/Time: 01/28/2015 11:18 AM Performed by: Rosaria FerriesPISCITELLO, Ednah Hammock K Pre-anesthesia Checklist: Patient identified, Patient being monitored, Timeout performed, Emergency Drugs available and Suction available Patient Re-evaluated:Patient Re-evaluated prior to inductionOxygen Delivery Method: Circle system utilized Preoxygenation: Pre-oxygenation with 100% oxygen Intubation Type: IV induction Ventilation: Mask ventilation without difficulty Laryngoscope Size: Mac and 3 Grade View: Grade II Tube type: Oral Tube size: 7.0 mm Number of attempts: 1 Airway Equipment and Method: Stylet Placement Confirmation: ETT inserted through vocal cords under direct vision,  positive ETCO2 and breath sounds checked- equal and bilateral Secured at: 21 cm Tube secured with: Tape Dental Injury: Teeth and Oropharynx as per pre-operative assessment

## 2015-01-28 NOTE — Anesthesia Postprocedure Evaluation (Signed)
  Anesthesia Post-op Note  Patient: Hailey Thomas  Procedure(s) Performed: Procedure(s): LAPAROSCOPIC CHOLECYSTECTOMY WITH INTRAOPERATIVE CHOLANGIOGRAM (N/A)  Anesthesia type:General ETT  Patient location: PACU  Post pain: Pain level controlled  Post assessment: Post-op Vital signs reviewed, Patient's Cardiovascular Status Stable, Respiratory Function Stable, Patent Airway and No signs of Nausea or vomiting  Post vital signs: Reviewed and stable  Last Vitals:  Filed Vitals:   01/28/15 1236  BP: 123/66  Pulse: 47  Temp:   Resp: 17    Level of consciousness: awake, alert  and patient cooperative  Complications: No apparent anesthesia complications

## 2015-01-28 NOTE — Transfer of Care (Signed)
Immediate Anesthesia Transfer of Care Note  Patient: Hailey Thomas  Procedure(s) Performed: Procedure(s): LAPAROSCOPIC CHOLECYSTECTOMY WITH INTRAOPERATIVE CHOLANGIOGRAM (N/A)  Patient Location: PACU  Anesthesia Type:General  Level of Consciousness: sedated  Airway & Oxygen Therapy: Patient Spontanous Breathing  Post-op Assessment: Report given to RN  Post vital signs: Reviewed and stable  Last Vitals:  Filed Vitals:   01/28/15 0737  BP: 131/72  Pulse: 46  Temp: 36.8 C  Resp: 16    Complications: No apparent anesthesia complications

## 2015-01-28 NOTE — Progress Notes (Signed)
[facility]   PATIENT NAME: Hailey Thomas    MR#:  [id number]  DATE OF BIRTH:  04/06/1966  SUBJECTIVE:   Denies any pain. No nausea no vomiting. Hungry and has questions regarding surgery.  REVIEW OF SYSTEMS:   Review of Systems  Constitutional: Positive for fever. Negative for chills.  Respiratory: Negative for cough.   Cardiovascular: Negative for chest pain and palpitations.  Gastrointestinal: Negative for nausea, vomiting and abdominal pain.    DRUG ALLERGIES:  No Known Allergies  VITALS:  Blood pressure 131/72, pulse 46, temperature 98.2 F (36.8 C), temperature source Oral, resp. rate 16, height 4\' 7"  (1.397 m), weight 69.219 kg (152 lb 9.6 oz), last menstrual period 01/02/2015, SpO2 99 %.  Filed Vitals:   01/27/15 1626 01/28/15 0007 01/28/15 0500 01/28/15 0737  BP: 139/89 145/79  131/72  Pulse: 55 64  46  Temp: 97.5 F (36.4 C) 98.6 F (37 C)  98.2 F (36.8 C)  TempSrc: Oral Oral  Oral  Resp: 16 17  16   Height:      Weight:   69.219 kg (152 lb 9.6 oz)   SpO2: 100% 98%  99%   PHYSICAL EXAMINATION:  GENERAL:  49 y.o.-year-old patient lying in the bed with no acute distress. No scleral icterus is noted. EYES: Pupils equal, round, reactive to light and accommodation.  Extraocular muscles intact.  HEENT: Head atraumatic, normocephalic. Oropharynx and nasopharynx clear.  NECK:  Supple, no jugular venous distention. No thyroid enlargement, no tenderness.  LUNGS: Normal breath sounds bilaterally, no wheezing, rales,rhonchi or crepitation. No use of accessory muscles of respiration.  CARDIOVASCULAR: S1, S2 normal. No murmurs, rubs, or gallops.  ABDOMEN: Soft, nontender, nondistended. Bowel sounds present. No organomegaly or mass.  EXTREMITIES: No pedal edema, cyanosis, or clubbing.  NEUROLOGIC: Cranial nerves II through XII are intact. Muscle strength 5/5 in all extremities. Sensation intact. Gait not checked.  PSYCHIATRIC: The patient is alert and  oriented x 3.  SKIN: No obvious rash, lesion, or ulcer.   CBC Latest Ref Rng 01/26/2015  WBC 3.6 - 11.0 K/uL 5.9  Hemoglobin 12.0 - 16.0 g/dL 40.914.4  Hematocrit 81.135.0 - 47.0 % 43.5  Platelets 150 - 440 K/uL 273     CMP Latest Ref Rng 01/28/2015 01/27/2015 01/26/2015  Glucose 65 - 99 mg/dL 914(N126(H) 829(F110(H) 621(H121(H)  BUN 6 - 20 mg/dL 10 7 13   Creatinine 0.44 - 1.00 mg/dL 0.860.59 5.780.52 4.690.58  Sodium 135 - 145 mmol/L 139 140 140  Potassium 3.5 - 5.1 mmol/L 3.9 3.6 3.6  Chloride 101 - 111 mmol/L 106 107 105  CO2 22 - 32 mmol/L 26 28 25   Calcium 8.9 - 10.3 mg/dL 9.0 6.2(X8.6(L) 9.6  Total Protein 6.5 - 8.1 g/dL 6.7 6.3(L) 7.7  Total Bilirubin 0.3 - 1.2 mg/dL 5.2(W1.6(H) 4.8(H) 3.6(H)  Alkaline Phos 38 - 126 U/L 120 118 120  AST 15 - 41 U/L 189(H) 530(H) 851(H)  ALT 14 - 54 U/L 434(H) 635(H) 604(H)      ASSESSMENT AND PLAN:   49 year old female with what looks like passed CBD stone. She has extensive cholelithiasis. MRI scan has been negative. Liver function tests bilirubin AST and ALT on a downtrend.  Plan for laparoscopic cholecystectomy with intraoperative cholangiography. Based on the cholangiography she may require further biliary intervention.  Reviewed the procedure with her and all of her questions were answered informed consent was obtained.

## 2015-01-28 NOTE — Anesthesia Preprocedure Evaluation (Addendum)
Anesthesia Evaluation  Patient identified by MRN, date of birth, ID band Patient awake    Reviewed: Allergy & Precautions, H&P , NPO status , Patient's Chart, lab work & pertinent test results, reviewed documented beta blocker date and time   History of Anesthesia Complications (+) Family history of anesthesia reaction  Airway Mallampati: II   Neck ROM: full    Dental  (+) Teeth Intact, Chipped   Pulmonary neg pulmonary ROS, neg COPD breath sounds clear to auscultation  Pulmonary exam normal       Cardiovascular Exercise Tolerance: Good - CAD and - Past MI negative cardio ROS Normal cardiovascular examRhythm:regular Rate:Normal     Neuro/Psych negative neurological ROS  negative psych ROS   GI/Hepatic negative GI ROS, Neg liver ROS,   Endo/Other  negative endocrine ROSneg diabetes  Renal/GU negative Renal ROS  negative genitourinary   Musculoskeletal   Abdominal   Peds negative pediatric ROS (+)  Hematology negative hematology ROS (+)   Anesthesia Other Findings 49 y.o. female who [facility] last night with a complaint of right upper quadrant pain. He states that this began yesterday early in the morning ever over the course of the day became worse. She did take some Alka-Seltzer which seem to be of no benefit. Came to the emergency room early this morning. She has had nausea but no vomiting. Had a similar episode several years ago.  Patient reports that her mother died while under anesthesia, however she does not know the cause.  Reports no knowledge of MH in the family history.    Reproductive/Obstetrics negative OB ROS                           Anesthesia Physical Anesthesia Plan  ASA: II  Anesthesia Plan: General ETT   Post-op Pain Management:    Induction:   Airway Management Planned:   Additional Equipment:    Intra-op Plan:   Post-operative Plan:   Informed Consent: I have reviewed the patients History and Physical, chart, labs and discussed the procedure including the risks, benefits and alternatives for the proposed anesthesia with the patient or authorized representative who has indicated his/her understanding and acceptance.   Dental Advisory Given  Plan Discussed with: Anesthesiologist and Surgeon  Anesthesia Plan Comments:        Anesthesia Quick Evaluation

## 2015-01-28 NOTE — Op Note (Signed)
01/26/2015 - 01/28/2015  12:35 PM  PATIENT:  Hailey Thomas  49 y.o. female  PRE-OPERATIVE DIAGNOSIS:  cholelithiasis,choledocholithiasis  POST-OPERATIVE DIAGNOSIS:  same  PROCEDURE:  Procedure(s): LAPAROSCOPIC CHOLECYSTECTOMY WITH INTRAOPERATIVE CHOLANGIOGRAM (N/A)  SURGEON:  Surgeon(s) and Role:    * Natale LayMark Wheeler Incorvaia, MD - Primary  ASSISTANTS: Scrub Tech  ANESTHESIA: General  SPECIMEN:gallbladder with contents.    EBL: minimal.  Findings: stones, normal IOC.  Description of procedure:    With informed consent supine position general oral endotracheal anesthesia the patient's left arm was padded and tucked at her side abdomen was widely prepped and draped utilizing chlor prep solution and a time out was observed.  An infraumbilical transversely oriented skin incision was fashion with scalpel and carried down with sharp dissection to the abdominal midline fascia. Fascia was incised in the midline and elevated with Kocher clamps. The peritoneum was entered sharply. An 000 vicryl stitch was placed on either side of the abdominal midline fascia as traction. 12 mm blunt Hassan trocar was placed under direct visualization. Pneumoperitoneum was established.  The patient was positioned in reverse Trendelenburg and rolled right side up. A 5 mm platelets trocar was placed in the epigastric region and 2 5 mm trochars placed in the right subcostal margin. Bladder was aspirated of approximately 20 cc of thick motor oil appearing bile. Gallbladder was grasped on its fundus and elevated towards right shoulder with spillage of bile because it tore. Lateral traction on Hartman's pouch was achieved.  A combination of blunt dissection with hook electrocautery demonstrated a critical view of safety cholecystectomy. The cystic artery was correctly identified triply clipped on the portal side singly clipped on the gallbladder side and divided.  The cystic duct was isolated. 10 mm Hemoclip was placed. A transverse  ductotomy was then fashion with sharp scissors. The Arrow percutaneous cholangiogram assembly was utilized. Catheter was inserted into the cystic duct and the balloon was occluded. Fluoroscopy was utilized to obtain cholangiography which demonstrated brisk filling of the duodenum and intrahepatic biliary radicals and no evidence of filling defect.  Catheter was removed. The cystic duct was triply clipped on the portal side and then divided. The gallbladder was then successfully retrieved from the gallbladder fossa utilizing hook cautery apparatus placed into an Endo Catch device and retrieved. During the retrieval process a 5 mm surgical telescope was introduced in the epigastric port demonstrating no evidence of bowel injury with trocar insertion. Pneumoperitoneum was reestablished. The right upper quadrant. Irrigated with nearly a liter of normal saline and aspirated dry hemostasis being obtained with point cautery the gallbladder fossa.  Ports are then removed under direct visualization. A total of 30 cc of 0.25% plain Marcaine was instilled along all skin fascial incisions prior to closure. An additional figure-of-eight #000 Vicryls suture was placed in vertical orientation in figure-of-eight fashion and the existing stay sutures tied to each other. 4-0 Vicryls sutures in subcuticular fashion was applied. Benzoin Steri-Strips Telfa and Tegaderm were then applied. The patient was then extubated and taken to recovery room in stable and satisfactory condition by anesthesia services.

## 2015-01-29 NOTE — Progress Notes (Signed)
She is doing very well post surgery. She does not have any wound problems and her pain level is minimal. We discussed her disability of 10-14 days out of work and gradual return to normal activity. They think driving and activity instructions were given the patient. We will plan discharge later this morning. She is in agreement

## 2015-01-29 NOTE — Progress Notes (Signed)
A&O. VSS. tolerating diet well. No complaints of pain or nausea. Ambulating with no assistance. Discharged per MD orders. IV removed per policy. Pt verbalized understanding of discharge instructions. Prescription given to pt. Discharged ambulatory.

## 2015-01-30 ENCOUNTER — Encounter: Payer: Self-pay | Admitting: Surgery

## 2015-01-30 ENCOUNTER — Telehealth: Payer: Self-pay

## 2015-01-31 ENCOUNTER — Telehealth: Payer: Self-pay | Admitting: Surgery

## 2015-01-31 DIAGNOSIS — K8071 Calculus of gallbladder and bile duct without cholecystitis with obstruction: Secondary | ICD-10-CM

## 2015-01-31 LAB — SURGICAL PATHOLOGY

## 2015-01-31 NOTE — Telephone Encounter (Signed)
PATIENT HAS PAID HER DISABILITY PAPERWORK FEE 01/31/15 - AA

## 2015-02-01 NOTE — Telephone Encounter (Signed)
Pt came into the office requesting a working note for days being admitted into the office. Advised pt this note is not a release to return to work but to excuse her to use personal days from work. Pt has a PO scheduled.

## 2015-02-07 ENCOUNTER — Ambulatory Visit: Payer: Self-pay | Admitting: Surgery

## 2015-02-08 NOTE — Addendum Note (Signed)
Addendum  created 02/08/15 1427 by Maryhelen Lindler K Tahisha Hakim, MD   Modules edited: Anesthesia Events, Narrator   Narrator:  Narrator: Event Log Edited    

## 2015-02-12 ENCOUNTER — Ambulatory Visit (INDEPENDENT_AMBULATORY_CARE_PROVIDER_SITE_OTHER): Payer: BLUE CROSS/BLUE SHIELD | Admitting: Surgery

## 2015-02-12 ENCOUNTER — Encounter: Payer: Self-pay | Admitting: Surgery

## 2015-02-12 VITALS — BP 137/82 | HR 68 | Temp 98.4°F | Wt 162.0 lb

## 2015-02-12 DIAGNOSIS — K8071 Calculus of gallbladder and bile duct without cholecystitis with obstruction: Secondary | ICD-10-CM

## 2015-02-12 NOTE — Progress Notes (Signed)
Subjective:     Patient ID: Hailey Thomas, female   DOB: 01/03/1966, 49 y.o.   MRN: [id number]  HPI 49 year old Hispanic female status post laparoscopic cholecystectomy on July 2. 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  Since surgery the patient is doing well. She's had no fever no jaundice.  She denies any abdominal pain. She is tolerating regular food.  Review of Systems  Constitutional: Negative.   Eyes: Negative.   Respiratory: Negative.   Gastrointestinal: Negative for abdominal pain and abdominal distention.       Objective:   Physical Exam  Constitutional: She is oriented to person, place, and time. She appears well-developed and well-nourished.  Pulmonary/Chest: Effort normal and breath sounds normal.  Abdominal: Soft. She exhibits no mass. There is no tenderness. There is no rebound and no guarding.  Incisions appear to be healing nicely.  Neurological: She is oriented to person, place, and time.       Assessment:     Doing well status post laparoscopic cholecystectomy for subacute and on chronic calculus cholecystitis.    Plan:     I will see the patient back as needed. Work note was provided for return with limited activity for 3 more weeks. I will see her back as needed.  A copy of the pathology report was provided to the patient in paper form.

## 2015-02-12 NOTE — Patient Instructions (Signed)
Give us a call if you need anything.

## 2015-02-13 NOTE — Discharge Summary (Signed)
Physician Discharge Summary  Patient ID: Hailey Thomas MRN: [id number] DOB/AGE: 51/09/1965 51 y.o.  Admit date: 01/26/2015 Discharge date: 01/29/2015  Admission Diagnoses: Acute cholecystitis Elevated bilirubin  Discharge Diagnoses:  Active Problems:   Cholelithiasis   Abdominal pain   Hyperbilirubinemia   Discharged Condition: Stable and improved  Hospital Course: The patient was admitted with elevated bilirubin and signs of choledocholithiasis. Surgical services were consulted. Patient underwent an MRCP. This demonstrated slightly dilated duct but no signs of choledocholithiasis. The patient underwent laparoscopic cholecystectomy with intraoperative cholangiography during her hospitalization once her liver function tests demonstrated marked improvement. She tolerated this well and was discharged home on July 14.      Discharge Exam:  Abdomen was soft and nontender she was awake and alert physical examination demonstrated dry incisions. She was tolerating a regular diet.  Disposition: 01-Home or Self Care  Discharge Instructions    Call MD for:  persistant nausea and vomiting    Complete by:  As directed      Call MD for:  redness, tenderness, or signs of infection (pain, swelling, redness, odor or green/yellow discharge around incision site)    Complete by:  As directed      Call MD for:  severe uncontrolled pain    Complete by:  As directed      Call MD for:  temperature >100.4    Complete by:  As directed      Diet general    Complete by:  As directed      Discharge instructions    Complete by:  As directed   DISCHARGE INSTRUCTIONS TO PATIENT  REMINDER:  Carry a list of your medications and allergies with you at all times Call your pharmacy at least 1 week in advance to refill prescriptions Do not mix any prescribed pain medicine with alcohol Do not drive any motor vehicles while taking pain medication. Take medications with food.  Do not retake a pain medication  if you vomit after taking it.  Activity: no lifting more than 15 pounds until instructed by your doctor.   Dressing Care Instruction (if applicable):              Remove operative dressings in 48 hours.  May Shower-  Call office if any questions regarding this activity.  Dry Dressing as needed to operative site.  Drain care instructions provided to you in the hospital.   Follow-up appointments (date to return to physician): Call for appointment with Dr. Natale LayMark Bev Drennen, MD at [phone] or [phone]  If need MD on call after hours and on weekends call Hospital operator at [phone] as ask to speak to Surgeon on call for [facility].  Call Surgeon if you have: Temperature greater than 100.4 Persistent nausea and vomiting Severe uncontrolled pain Redness, tenderness, or signs of infection (pain, swelling, redness, odor or green/yellow discharge around the site) Difficulty breathing, headache or visual disturbances Hives Persistent dizziness or light-headedness Extreme fatigue Any other questions or concerns you may have after discharge  In an emergency, call 911 or go to an Emergency Department at a nearby hospital  Diet:  Resume your usual diet.  Avoid spicy, greasy or heavy foods.  If you have nausea or vomiting, go back to liquids.  If you cannot keep liquids down, call your doctor.  Avoid alcohol consumption while on prescription pain medications. Good nutrition promotes healing. Increase fiber and fluids.     I understand and acknowledge receipt of the above instructions.  Patient or Guardian Signature                                                                    Date/Time                                                                                                                                        Physician's or  R.N.'s Signature                                                                  Date/Time  The discharge instructions have been reviewed with the patient and/or Family Member/Parent/Guardian.  Patient and/or Family Member/Parent/Guardian signed and retained a printed copy.     Increase activity slowly    Complete by:  As directed      Remove dressing in 48 hours    Complete by:  As directed             Medication List    TAKE these medications        Vitamin D3 5000 UNITS Tabs  Take 1 tablet by mouth daily.           Follow-up Information    Follow up with Natale Lay, MD In 10 days.   Specialties:  Surgery, Radiology   Contact information:   [address] [place] [address] [phone]       Signed: Natale Lay 02/13/2015, 1:34 PM

## 2016-05-20 IMAGING — MR MR ABDOMEN W/ CM MRCP
12 of 19 series · 26 of 48 positions shown · non-contrast
Comparison: Ultrasound exam from earlier today.

CLINICAL DATA: Subsequent encounter for severe upper epigastric
pain since yesterday nausea and vomiting with multiple gallstones
seen on recent ultrasound exam.

EXAM:
MRI ABDOMEN WITHOUT CONTRAST  (INCLUDING MRCP)
TECHNIQUE: Multiplanar multisequence MR imaging of the abdomen was performed.
Heavily T2-weighted images of the biliary and pancreatic ducts were
obtained, and three-dimensional MRCP images were rendered by post
processing.

[Series 4: T2 fat-sat · axial · 8.0mm · 0.74mm/px · 1 of 25 slices shown]
[im 1/25]
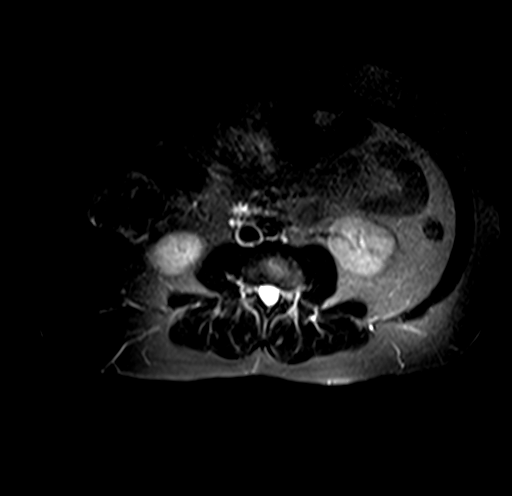

[Series 5: cor tru fisp · coronal · 4.0mm · 0.78mm/px · 1 of 58 slices shown]
[im 1/58]
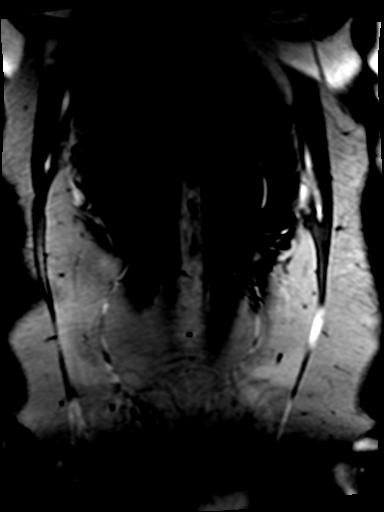

[Series 12: DWI · axial · 8.0mm · 1.98mm/px · z∈[-81,+169]mm · 2 of 81 slices shown]
[im 1/81]
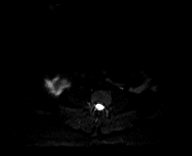
[im 81/81]
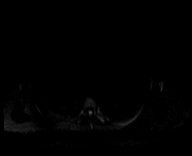

[Series 13: ax dwi_adc · axial · 8.0mm · 1.98mm/px · 1 of 27 slices shown]
[im 1/27]
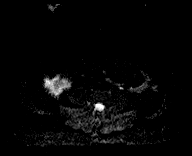

[Series 14: cor thins · coronal · 4.0mm · 0.89mm/px · 1 of 11 slices shown]
[im 1/11]
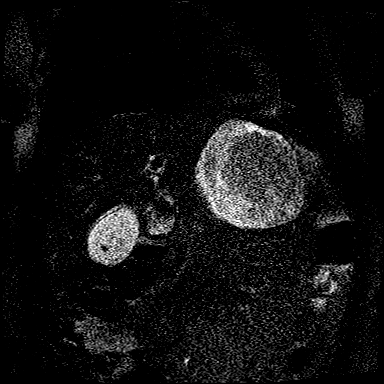

[Series 16: T2 · axial · 7.3mm · 0.74mm/px · 1 of 33 slices shown]
[im 1/33]
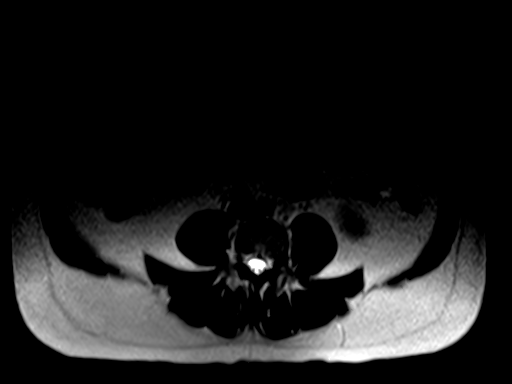

[Series 17: T1 dynamic fat-sat · axial · non-contrast · 2.5mm · 0.74mm/px · z∈[-93,+165]mm · 3 of 104 slices shown (1 of 2)]
[im 1/104]
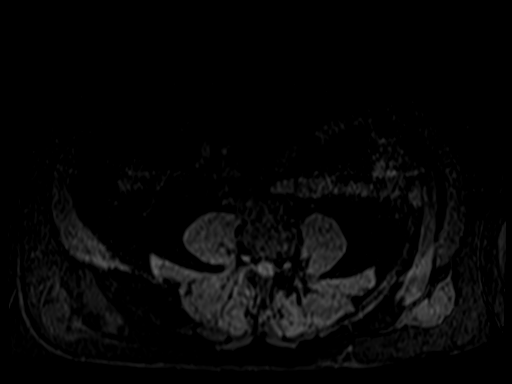
[im 52/104]
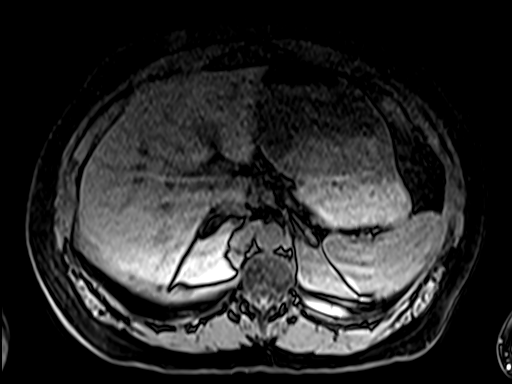
[im 104/104]
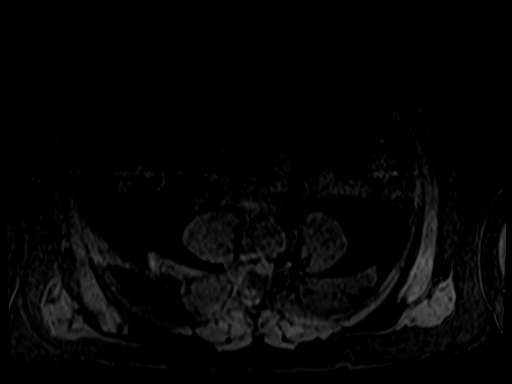

[Series 18: T1 dynamic fat-sat · axial · 2.5mm · 0.74mm/px · z∈[-93,+165]mm · 3 of 104 slices shown (2 of 2)]
[im 1/104]
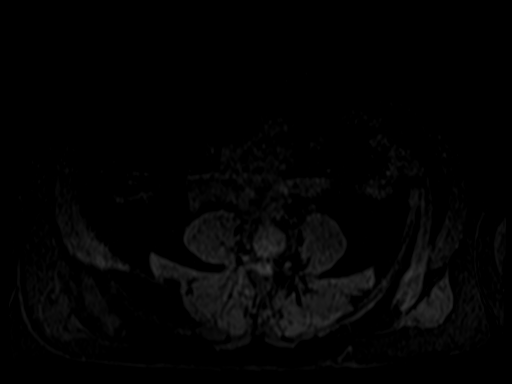
[im 52/104]
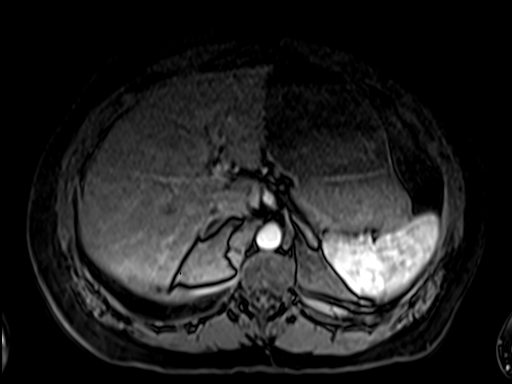
[im 104/104]
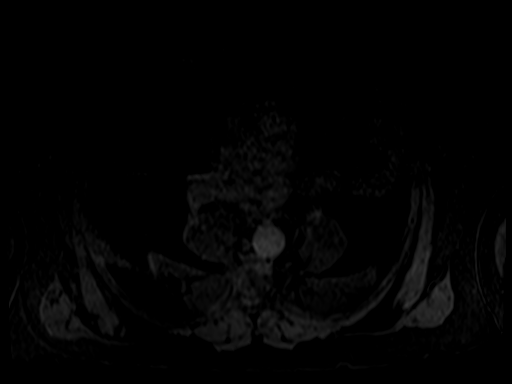

[Series 19: T1 dynamic fat-sat post-contrast · axial · 2.5mm · 0.74mm/px · z∈[-93,+165]mm · 4 of 104 slices shown (1 of 4)]
[im 1/104]
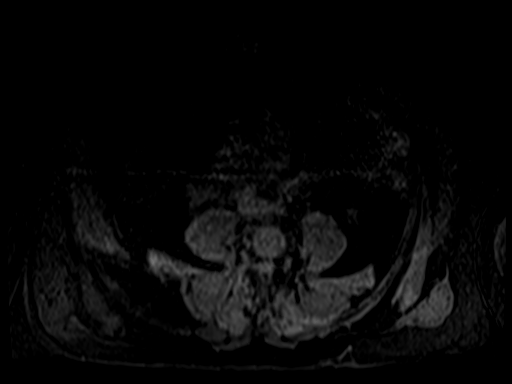
[im 35/104]
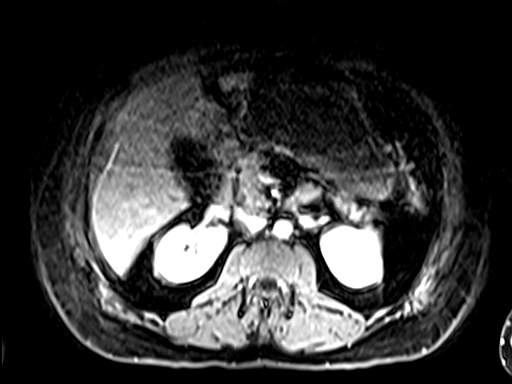
[im 69/104]
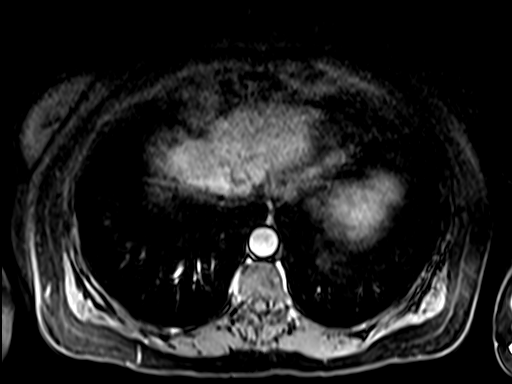
[im 104/104]
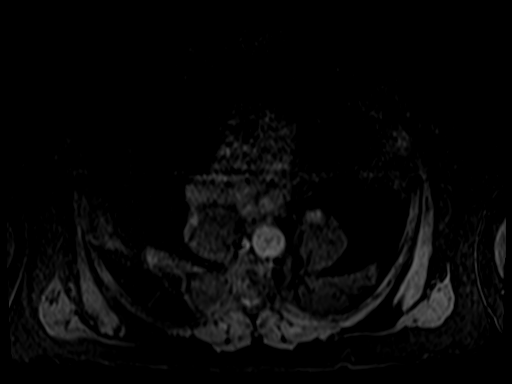

[Series 20: T1 dynamic fat-sat post-contrast · axial · 2.5mm · 0.74mm/px · z∈[-93,+165]mm · 4 of 104 slices shown (2 of 4)]
[im 1/104]
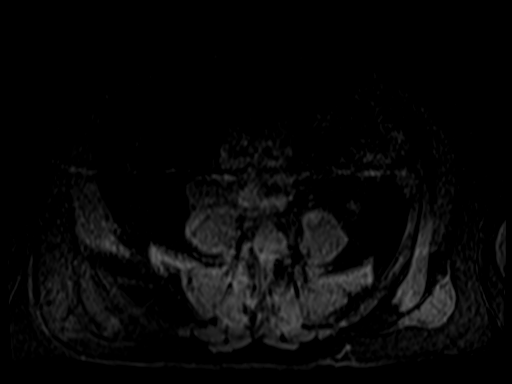
[im 35/104]
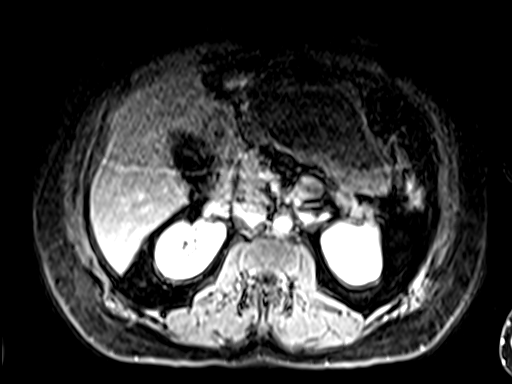
[im 69/104]
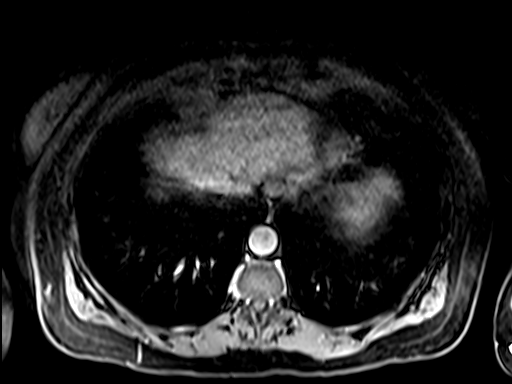
[im 104/104]
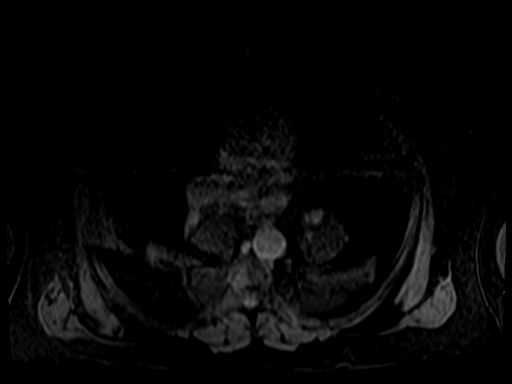

[Series 21: T1 dynamic fat-sat post-contrast · coronal · 2.5mm · 0.82mm/px · 4 of 112 slices shown (3 of 4)]
[im 1/112]
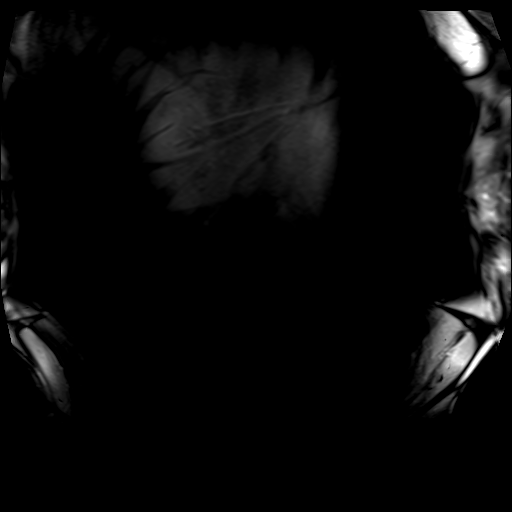
[im 38/112]
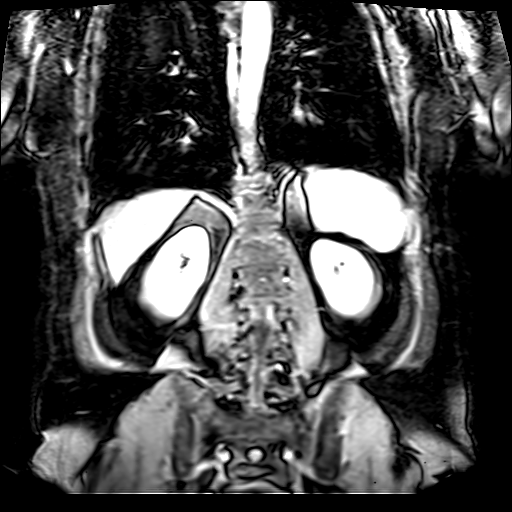
[im 75/112]
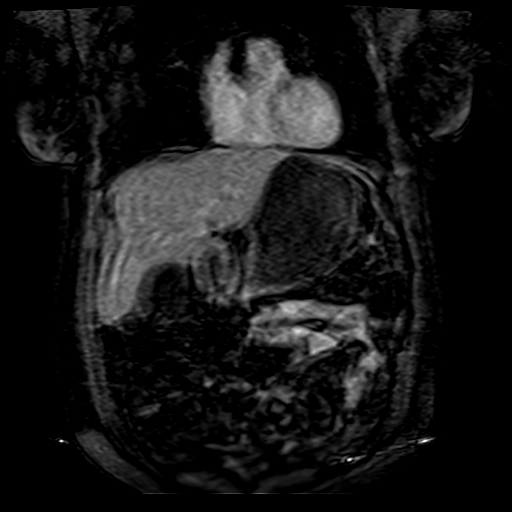
[im 112/112]
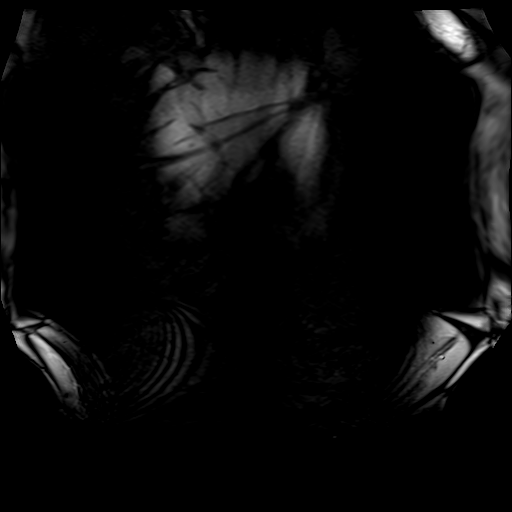

[Series 22: T1 dynamic fat-sat post-contrast · axial · 2.5mm · 0.74mm/px · 1 of 104 slices shown (4 of 4)]
[im 1/104]
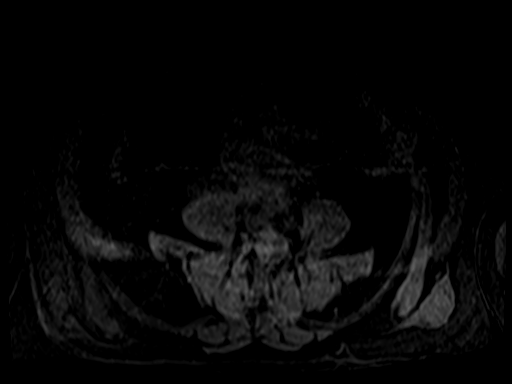

[26 of 48 positions shown; findings below may reference images not displayed]

FINDINGS: Lower chest:  Unremarkable.

Hepatobiliary: No focal abnormality within the liver parenchyma. All
multiple gallstones are evident measuring up to 10 mm in diameter.
No evidence for gallbladder wall thickening or pericholecystic
fluid. There is no intra or extrahepatic biliary duct dilatation. No
evidence for choledocholithiasis.

Pancreas: No focal mass lesion. No dilatation of the main duct. No
intraparenchymal cyst. No peripancreatic edema.

Spleen: No splenomegaly. No focal mass lesion.

Adrenals/Urinary Tract: No adrenal nodule or mass. No focal
abnormality or hydronephrosis is seen in either kidney.

Stomach/Bowel: Stomach is distended with food and fluid. Duodenum is
normally positioned as is the ligament of Treitz.

Vascular/Lymphatic: No abdominal aortic aneurysm. No evidence for
lymphadenopathy in the abdomen

Other: No intraperitoneal free fluid.

Musculoskeletal: No abnormal marrow signal within the visualized
bony anatomy.
IMPRESSION: Cholelithiasis without gallbladder wall thickening or
pericholecystic fluid.

No evidence for biliary duct dilatation.  No choledocholithiasis.

## 2020-02-19 ENCOUNTER — Emergency Department: Payer: BC Managed Care – PPO

## 2020-02-19 ENCOUNTER — Emergency Department
Admission: EM | Admit: 2020-02-19 | Discharge: 2020-02-19 | Disposition: A | Discharge status: Home / Self Care | Payer: BC Managed Care – PPO | Attending: Emergency Medicine | Admitting: Emergency Medicine

## 2020-02-19 ENCOUNTER — Other Ambulatory Visit: Payer: Self-pay

## 2020-02-19 ENCOUNTER — Encounter: Payer: Self-pay | Admitting: Emergency Medicine

## 2020-02-19 DIAGNOSIS — R0789 Other chest pain: Secondary | ICD-10-CM | POA: Insufficient documentation

## 2020-02-19 LAB — CBC
HCT: 40.8 % (ref 36.0–46.0)
Hemoglobin: 14.2 g/dL (ref 12.0–15.0)
MCH: 29.6 pg (ref 26.0–34.0)
MCHC: 34.8 g/dL (ref 30.0–36.0)
MCV: 85 fL (ref 80.0–100.0)
Platelets: 257 10*3/uL (ref 150–400)
RBC: 4.8 MIL/uL (ref 3.87–5.11)
RDW: 12.1 % (ref 11.5–15.5)
WBC: 8.3 10*3/uL (ref 4.0–10.5)
nRBC: 0 % (ref 0.0–0.2)

## 2020-02-19 LAB — TROPONIN I (HIGH SENSITIVITY)
Troponin I (High Sensitivity): 7 ng/L (ref <18)
Troponin I (High Sensitivity): 5 ng/L (ref <18)

## 2020-02-19 LAB — BASIC METABOLIC PANEL
Anion gap: 12 (ref 5–15)
BUN: 13 mg/dL (ref 6–20)
CO2: 23 mmol/L (ref 22–32)
Calcium: 9.5 mg/dL (ref 8.9–10.3)
Chloride: 102 mmol/L (ref 98–111)
Creatinine, Ser: 0.61 mg/dL (ref 0.44–1.00)
GFR calc Af Amer: >60 mL/min (ref >60)
GFR calc non Af Amer: >60 mL/min (ref >60)
Glucose, Bld: 129 mg/dL — ABNORMAL HIGH (ref 70–99)
Potassium: 3.7 mmol/L (ref 3.5–5.1)
Sodium: 137 mmol/L (ref 135–145)

## 2020-02-19 MED ORDER — NITROGLYCERIN 0.4 MG SL SUBL
0.4000 mg | SUBLINGUAL_TABLET | Freq: Once | SUBLINGUAL | Status: AC
Start: 2020-02-19 — End: 2020-02-19
  Administered 2020-02-19: 0.4 mg via SUBLINGUAL
  Filled 2020-02-19: qty 1

## 2020-02-19 NOTE — ED Provider Notes (Signed)
[facility] Emergency Department Provider Note   ____________________________________________    I have reviewed the triage vital signs and the nursing notes.   HISTORY  Chief Complaint Chest Pain     HPI Hailey Thomas is a 54 y.o. female who presents with complaints of chest tightness.  Patient reports intermittent episodes of chest tightness.  She had one last night at 10 PM which improved after an hour.  Occurred again in the middle of the night.  Currently feels well and has no complaints.  Denies a history of heart disease.  No shortness of breath.  No cough.  No pleurisy.  No fevers or chills.  No nausea vomiting or diaphoresis.  No exertional chest pain.  Has not take anything for this  Past Medical History:  Diagnosis Date  . Elevated liver function tests     Patient Active Problem List   Diagnosis Date Noted  . Abdominal pain   . Hyperbilirubinemia   . Cholelithiasis 01/26/2015    Past Surgical History:  Procedure Laterality Date  . CHOLECYSTECTOMY N/A 01/28/2015   Procedure: LAPAROSCOPIC CHOLECYSTECTOMY WITH INTRAOPERATIVE CHOLANGIOGRAM;  Surgeon: Natale Lay, MD;  Location: [facility] ORS;  Service: General;  Laterality: N/A;    Prior to Admission medications   Medication Sig Start Date End Date Taking? Authorizing Provider  Cholecalciferol (VITAMIN D3) 5000 UNITS TABS Take 1 tablet by mouth daily.    [provider]     Allergies Patient has no known allergies.  Family History  Problem Relation Age of Onset  . Cancer Mother        Breast  . Hypertension Father   . Stroke Father     Social History Social History   Tobacco Use  . Smoking status: Never Smoker  . Smokeless tobacco: Never Used  Substance Use Topics  . Alcohol use: No  . Drug use: Never    Review of Systems  Constitutional: No fever/chills Eyes: No visual changes.  ENT: No sore throat. Cardiovascular: As above Respiratory: Denies shortness of  breath. Gastrointestinal: No abdominal pain.   Genitourinary: Negative for dysuria. Musculoskeletal: Negative for back pain. Skin: Negative for rash. Neurological: Negative for headaches   ____________________________________________   PHYSICAL EXAM:  VITAL SIGNS: ED Triage Vitals  Enc Vitals Group     BP 02/19/20 0627 (!) 160/89     Pulse Rate 02/19/20 0627 73     Resp 02/19/20 0627 18     Temp 02/19/20 0627 98.2 F (36.8 C)     Temp Source 02/19/20 0627 Oral     SpO2 02/19/20 0627 97 %     Weight 02/19/20 0628 76.2 kg (168 lb)     Height 02/19/20 0628 1.397 m (4\' 7" )     Head Circumference --      Peak Flow --      Pain Score 02/19/20 0627 7     Pain Loc --      Pain Edu? --      Excl. in GC? --     Constitutional: Alert and oriented.  Nose: No congestion/rhinnorhea. Mouth/Throat: Mucous membranes are moist.   Cardiovascular: Normal rate, regular rhythm. Grossly normal heart sounds.  Good peripheral circulation. Respiratory: Normal respiratory effort.  No retractions. Lungs CTAB. Gastrointestinal: Soft and nontender. No distention.  No CVA tenderness.  Musculoskeletal: No lower extremity tenderness nor edema.  Warm and well perfused Neurologic:  Normal speech and language. No gross focal neurologic deficits are appreciated.  Skin:  Skin is warm, dry and intact. No rash noted. Psychiatric: Mood and affect are normal. Speech and behavior are normal.  ____________________________________________   LABS (all labs ordered are listed, but only abnormal results are displayed)  Labs Reviewed  BASIC METABOLIC PANEL - Abnormal; Notable for the following components:      Result Value   Glucose, Bld 129 (*)    All other components within normal limits  CBC  POC URINE PREG, ED  TROPONIN I (HIGH SENSITIVITY)  TROPONIN I (HIGH SENSITIVITY)   ____________________________________________  EKG ED ECG REPORT I, Jene Every, the attending physician, personally viewed  and interpreted this ECG.  Date: 02/19/2020  Rhythm: normal sinus rhythm QRS Axis: normal Intervals: normal ST/T Wave abnormalities: normal Narrative Interpretation: no evidence of acute ischemia  ____________________________________________  RADIOLOGY  X-ray reviewed by me, no infiltrate, edema or pneumothorax ____________________________________________   PROCEDURES  Procedure(s) performed: No  Procedures   Critical Care performed: No ____________________________________________   INITIAL IMPRESSION / ASSESSMENT AND PLAN / ED COURSE  Pertinent labs & imaging results that were available during my care of the patient were reviewed by me and considered in my medical decision making (see chart for details).  Patient presents with complaints of chest pain as above.  EKG is reassuring.  Differential diagnosis includes musculoskeletal chest pain, GERD, ACS, angina  Lab work today is reassuring.  Initial cardiac enzyme is 7, repeat is 5  BMP and CBC are normal.  Chest x-ray is benign.  Given reassuring work-up patient is appropriate for discharge, will follow up closely with cardiology, have asked her to return if her chest pain returns.    ____________________________________________   FINAL CLINICAL IMPRESSION(S) / ED DIAGNOSES  Final diagnoses:  Atypical chest pain        Note:  This document was prepared using Dragon voice recognition software and may include unintentional dictation errors.   Jene Every, MD 02/19/20 [phone]

## 2020-02-19 NOTE — ED Triage Notes (Signed)
Patient with complaint of intermittent chest soreness that started at 22:00 last night. Patient denies shortness of breath, nausea and vomiting.

## 2020-03-19 NOTE — Progress Notes (Deleted)
Cardiology Office Note  Date:  03/19/2020   ID:  Hailey Thomas, [place] [birth year], MRN [id number]  PCP:  Patient, No Pcp Per   No chief complaint on file.   HPI:    Seen in the ER 02/19/2020 for chest pain  intermittent episodes of chest tightness.  She had one last night at 10 PM which improved after an hour.  Occurred again in the middle of the night.  Currently feels well and has no complaints.  Denies a history of heart disease.  No shortness of breath.  No cough.  No pleurisy.  No fevers or chills.  No nausea vomiting or diaphoresis.  No exertional chest pain  Lab work today is reassuring.  Initial cardiac enzyme is 7, repeat is 5  BMP and CBC are normal.  Chest x-ray is benign.      PMH:   has a past medical history of Elevated liver function tests.  PSH:    Past Surgical History:  Procedure Laterality Date  . CHOLECYSTECTOMY N/A 01/28/2015   Procedure: LAPAROSCOPIC CHOLECYSTECTOMY WITH INTRAOPERATIVE CHOLANGIOGRAM;  Surgeon: Natale Lay, MD;  Location: [facility] ORS;  Service: General;  Laterality: N/A;    Current Outpatient Medications  Medication Sig Dispense Refill  . Cholecalciferol (VITAMIN D3) 5000 UNITS TABS Take 1 tablet by mouth daily.     No current facility-administered medications for this visit.     Allergies:   Patient has no known allergies.   Social History:  The patient  reports that she has never smoked. She has never used smokeless tobacco. She reports that she does not drink alcohol and does not use drugs.   Family History:   family history includes Cancer in her mother; Hypertension in her father; Stroke in her father.    Review of Systems: ROS   PHYSICAL EXAM: VS:  LMP 01/02/2015  , BMI There is no height or weight on file to calculate BMI. GEN: Well nourished, well developed, in no acute distress HEENT: normal Neck: no JVD, carotid bruits, or masses Cardiac: RRR; no murmurs, rubs, or gallops,no edema  Respiratory:  clear to auscultation  bilaterally, normal work of breathing GI: soft, nontender, nondistended, + BS MS: no deformity or atrophy Skin: warm and dry, no rash Neuro:  Strength and sensation are intact Psych: euthymic mood, full affect    Recent Labs: 02/19/2020: BUN 13; Creatinine, Ser 0.61; Hemoglobin 14.2; Platelets 257; Potassium 3.7; Sodium 137    Lipid Panel No results found for: CHOL, HDL, LDLCALC, TRIG    Wt Readings from Last 3 Encounters:  02/19/20 168 lb (76.2 kg)  02/12/15 162 lb (73.5 kg)  01/28/15 152 lb 9.6 oz (69.2 kg)       ASSESSMENT AND PLAN:  Problem List Items Addressed This Visit    None       Disposition:   F/U  12 months   Total encounter time more than 25 minutes  Greater than 50% was spent in counseling and coordination of care with the patient    Signed, Dossie Arbour, M.D., Ph.D. [facility] [place], [place] [phone]

## 2020-03-21 ENCOUNTER — Ambulatory Visit: Payer: BC Managed Care – PPO | Admitting: Cardiovascular Disease

## 2020-03-22 ENCOUNTER — Encounter: Payer: Self-pay | Admitting: Cardiovascular Disease
